# Patient Record
Sex: Male | Born: 1960 | Race: White | Hispanic: No | Marital: Single | Smoking: Never smoker
Health system: Southern US, Community
[De-identification: ages and names within clinical notes are randomized; demographics above are authoritative.]

## PROBLEM LIST (undated history)

## (undated) DIAGNOSIS — I10 Essential (primary) hypertension: Secondary | ICD-10-CM

## (undated) DIAGNOSIS — E119 Type 2 diabetes mellitus without complications: Secondary | ICD-10-CM

## (undated) HISTORY — PX: GANGLION CYST EXCISION: SHX1691

## (undated) HISTORY — PX: KNEE ARTHROSCOPY W/ ACL RECONSTRUCTION: SHX1858

---

## 2001-06-01 ENCOUNTER — Ambulatory Visit (HOSPITAL_BASED_OUTPATIENT_CLINIC_OR_DEPARTMENT_OTHER): Admission: RE | Admit: 2001-06-01 | Discharge: 2001-06-01 | Payer: Self-pay | Admitting: Family Medicine

## 2001-08-12 ENCOUNTER — Encounter: Payer: Self-pay | Admitting: Critical Care Medicine

## 2001-08-12 ENCOUNTER — Ambulatory Visit (HOSPITAL_BASED_OUTPATIENT_CLINIC_OR_DEPARTMENT_OTHER): Admission: RE | Admit: 2001-08-12 | Discharge: 2001-08-12 | Payer: Self-pay | Admitting: Critical Care Medicine

## 2005-03-19 ENCOUNTER — Ambulatory Visit: Payer: Self-pay | Admitting: Critical Care Medicine

## 2007-01-28 ENCOUNTER — Ambulatory Visit: Payer: Self-pay | Admitting: Critical Care Medicine

## 2008-11-29 ENCOUNTER — Telehealth (INDEPENDENT_AMBULATORY_CARE_PROVIDER_SITE_OTHER): Payer: Self-pay | Admitting: *Deleted

## 2008-11-30 ENCOUNTER — Encounter: Payer: Self-pay | Admitting: Critical Care Medicine

## 2008-11-30 DIAGNOSIS — G4733 Obstructive sleep apnea (adult) (pediatric): Secondary | ICD-10-CM | POA: Insufficient documentation

## 2009-01-02 ENCOUNTER — Ambulatory Visit: Payer: Self-pay | Admitting: Critical Care Medicine

## 2009-01-02 DIAGNOSIS — G4730 Sleep apnea, unspecified: Secondary | ICD-10-CM | POA: Insufficient documentation

## 2009-01-02 DIAGNOSIS — I10 Essential (primary) hypertension: Secondary | ICD-10-CM | POA: Insufficient documentation

## 2009-04-03 ENCOUNTER — Telehealth (INDEPENDENT_AMBULATORY_CARE_PROVIDER_SITE_OTHER): Payer: Self-pay | Admitting: *Deleted

## 2010-10-20 ENCOUNTER — Ambulatory Visit: Payer: Self-pay | Admitting: Critical Care Medicine

## 2010-11-28 ENCOUNTER — Encounter: Admission: RE | Admit: 2010-11-28 | Discharge: 2010-11-28 | Payer: Self-pay | Admitting: Family Medicine

## 2011-01-27 NOTE — Assessment & Plan Note (Signed)
Summary: Pulmonary OV   CC:  Follow up.  Last seen 12/2008.  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  No complaints.  .  History of Present Illness: [name] is a 50 year old white male, history of obstructive sleep apnea, maintains C-PAP at 8 cm water pressure via mask.    January 02, 2009 12:08 PM Last OV 2008.  See sleep form:   October 20, 2010 3:32 PM No new issues. ON cap 8cm h20 and doing well.     History of Present Illness: No real drowsiness   What time do you typically go to bed?(between what hours): 10-11pm  How long does it take you to fall asleep? Falls asleep quickly  How many times during the night do you wake up? Does not awaken at night  What time do you get out of bed to start your day? 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 (in pounds): Weight is up a few #     Current Medications (verified): 1)  Azor 10-40 Mg Tabs (Amlodipine-Olmesartan) .... Once Daily 2)  Zyrtec Allergy 10 Mg Tabs (Cetirizine Hcl) .... As Needed  Allergies (verified): No Known Drug Allergies  Past History:  Past medical, surgical, family and social histories (including risk factors) reviewed, and no changes noted (except as noted below).  Past Medical History: Reviewed history from 01/02/2009 and no changes required. Hypertension Sleep Apnea  Past Surgical History: Reviewed history from 01/02/2009 and no changes required. no new issues  Family History: Reviewed history and no changes required.  Social History: Reviewed history and no changes required. Never smoked  Vital Signs:  Patient profile:   50 year old male Height:      68 inches Weight:      239 pounds BMI:     36.47 O2 Sat:      97 % on Room air Temp:     98.6 degrees F oral Pulse rate:   92 / minute BP sitting:   130 / 78   (left arm) Cuff size:   large  Vitals Entered By: Gweneth Dimitri RN (October 20, 2010 3:16 PM)  O2 Flow:  Room air CC: Follow up.  Last seen 12/2008.  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  No complaints.   Comments Medications reviewed with patient Daytime contact number verified with patient. Gweneth Dimitri RN  October 20, 2010 3:17 PM    Physical Exam  Additional Exam:  Gen: Pleasant, well-nourished, in no distress,  normal affect ENT: No lesions,  mouth clear,  oropharynx clear, no postnasal drip Neck: No JVD, no TMG, no carotid bruits Lungs: No use of accessory muscles, no dullness to percussion, clear without rales or rhonchi Cardiovascular: RRR, heart sounds normal, no murmur or gallops, no peripheral edema Abdomen: soft and NT, no HSM,  BS normal Musculoskeletal: No deformities, no cyanosis or clubbing Neuro: alert, non focal Skin: Warm, no lesions or rashes    Impression & Recommendations:  Problem # 1:  SLEEP APNEA (ICD-780.57) Assessment Unchanged stable sleep apnea plan cont cpap as is pt is compliant with cpap Orders: Est. Patient Level III (16109)  Complete Medication List: 1)  Azor 10-40 Mg Tabs (Amlodipine-olmesartan) .... Once daily 2)  Zyrtec Allergy 10 Mg Tabs (Cetirizine hcl) .Marland KitchenMarland KitchenMarland Kitchen  As needed  Sleep Study  Procedure date:  10/15/2010  Findings:      Final CPAP pressure: 8 good compliance    Patient Instructions: 1)  No change in medications 2)  Stay on Cpap 8cmh20 as before 3)  Return in   12       months

## 2011-05-15 NOTE — Assessment & Plan Note (Signed)
[facility]                             PULMONARY OFFICE NOTE   SEANN, GENTHER                      MRN:          [id number]  DATE:01/28/2007                            DOB:          1961/04/16    Mr. Mccannon is a 50 year old white male, history of obstructive sleep  apnea, maintains C-PAP at 8 cm water pressure via mask.  He is doing  well, without complaints.  Weight maintains at 234, temperature 98,  blood pressure 146/84, pulse 82, saturation 94% on room air.  Chest  showed to be clear, without evidence of wheeze or rhonchi.  Cardiac exam  showed a regular rate and rhythm at S3, normal S1, S2.  Abdomen was protuberant.  Extremities showed no edema or clubbing.  Skin  was clear.  Neurologic exam was intact.  HEENT exam showed no jugular  venous distention, lymphadenopathy. Oropharynx clear.  Neck supple.   IMPRESSION:  That of stable obstructive sleep apnea.   PLAN:  For the patient to main C-PAP at 8 cm water pressure and we will  see the patient back in return followup in 24 months.     Charlcie Cradle Delford Field, MD, [facility]  Electronically Signed    PEW/MedQ  DD: 01/28/2007  DT: 01/28/2007  Job #: [id number]   cc:   Caryn Bee L. Little, M.D.

## 2013-01-22 ENCOUNTER — Emergency Department (HOSPITAL_COMMUNITY)
Admission: EM | Admit: 2013-01-22 | Discharge: 2013-01-22 | Disposition: A | Discharge status: Home / Self Care | Payer: BC Managed Care – PPO | Attending: Emergency Medicine | Admitting: Emergency Medicine

## 2013-01-22 ENCOUNTER — Encounter (HOSPITAL_COMMUNITY): Payer: Self-pay | Admitting: Emergency Medicine

## 2013-01-22 DIAGNOSIS — Y929 Unspecified place or not applicable: Secondary | ICD-10-CM | POA: Insufficient documentation

## 2013-01-22 DIAGNOSIS — Y939 Activity, unspecified: Secondary | ICD-10-CM | POA: Insufficient documentation

## 2013-01-22 DIAGNOSIS — X58XXXA Exposure to other specified factors, initial encounter: Secondary | ICD-10-CM | POA: Insufficient documentation

## 2013-01-22 DIAGNOSIS — T50901A Poisoning by unspecified drugs, medicaments and biological substances, accidental (unintentional), initial encounter: Secondary | ICD-10-CM | POA: Insufficient documentation

## 2013-01-22 DIAGNOSIS — T7840XA Allergy, unspecified, initial encounter: Secondary | ICD-10-CM

## 2013-01-22 HISTORY — DX: Type 2 diabetes mellitus without complications: E11.9

## 2013-01-22 HISTORY — DX: Essential (primary) hypertension: I10

## 2013-01-22 LAB — CBC WITH DIFFERENTIAL/PLATELET
Hemoglobin: 16.7 g/dL (ref 13.0–17.0)
Lymphocytes Relative: 5 % — ABNORMAL LOW (ref 12–46)
Lymphs Abs: 0.7 10*3/uL (ref 0.7–4.0)
Monocytes Relative: 2 % — ABNORMAL LOW (ref 3–12)
Neutro Abs: 11.5 10*3/uL — ABNORMAL HIGH (ref 1.7–7.7)
Neutrophils Relative %: 92 % — ABNORMAL HIGH (ref 43–77)
RBC: 5.37 MIL/uL (ref 4.22–5.81)

## 2013-01-22 LAB — COMPREHENSIVE METABOLIC PANEL
Albumin: 4 g/dL (ref 3.5–5.2)
Alkaline Phosphatase: 92 U/L (ref 39–117)
BUN: 14 mg/dL (ref 6–23)
CO2: 22 mEq/L (ref 19–32)
Chloride: 93 mEq/L — ABNORMAL LOW (ref 96–112)
GFR calc Af Amer: >90 mL/min (ref >90)
Glucose, Bld: 144 mg/dL — ABNORMAL HIGH (ref 70–99)
Potassium: 3 mEq/L — ABNORMAL LOW (ref 3.5–5.1)
Total Bilirubin: 0.9 mg/dL (ref 0.3–1.2)

## 2013-01-22 MED ORDER — METHYLPREDNISOLONE SODIUM SUCC 125 MG IJ SOLR
INTRAMUSCULAR | Status: AC
  Administered 2013-01-22: 125 mg via INTRAVENOUS
  Filled 2013-01-22: qty 2

## 2013-01-22 MED ORDER — DIPHENHYDRAMINE HCL 50 MG/ML IJ SOLN
INTRAMUSCULAR | Status: AC
  Administered 2013-01-22: 25 mg via INTRAVENOUS
  Filled 2013-01-22: qty 1

## 2013-01-22 MED ORDER — FAMOTIDINE IN NACL 20-0.9 MG/50ML-% IV SOLN
20.0000 mg | Freq: Once | INTRAVENOUS | Status: AC
  Administered 2013-01-22: 20 mg via INTRAVENOUS

## 2013-01-22 MED ORDER — FAMOTIDINE IN NACL 20-0.9 MG/50ML-% IV SOLN
INTRAVENOUS | Status: AC
  Administered 2013-01-22: 20 mg via INTRAVENOUS
  Filled 2013-01-22: qty 50

## 2013-01-22 MED ORDER — SODIUM CHLORIDE 0.9 % IV BOLUS (SEPSIS)
1000.0000 mL | Freq: Once | INTRAVENOUS | Status: AC
  Administered 2013-01-22: 1000 mL via INTRAVENOUS

## 2013-01-22 MED ORDER — POTASSIUM CHLORIDE CRYS ER 20 MEQ PO TBCR
40.0000 meq | EXTENDED_RELEASE_TABLET | Freq: Once | ORAL | Status: AC
  Administered 2013-01-22: 40 meq via ORAL
  Filled 2013-01-22: qty 2

## 2013-01-22 MED ORDER — DIPHENHYDRAMINE HCL 50 MG/ML IJ SOLN
25.0000 mg | Freq: Once | INTRAMUSCULAR | Status: AC
  Administered 2013-01-22: 25 mg via INTRAVENOUS

## 2013-01-22 MED ORDER — METHYLPREDNISOLONE SODIUM SUCC 125 MG IJ SOLR
125.0000 mg | Freq: Once | INTRAMUSCULAR | Status: AC
  Administered 2013-01-22: 125 mg via INTRAVENOUS

## 2013-01-22 NOTE — ED Provider Notes (Signed)
History     CSN: [id number]  Arrival date & time 01/22/13  1617   First MD Initiated Contact with Patient 01/22/13 1619      No chief complaint on file.    HPI  The patient presents with worsening tongue swelling, hives, generalized discomfort. This episode began 3 days ago, initially with lip swelling.  He initially spoke with his physician, began taking Benadryl daily.  Over the interval days he developed diffuse urticarial lesions, generalized discomfort, and over the past half day has developed glossal edema, difficulty swallowing.  His diffuse tingling has become more severe, and his urticarial lesions have increased, become diffuse.  There is no fever, no chest pain, no dyspnea, no abdominal pain, no nausea, no vomiting. Earlier today he went to an urgent care Center, received prednisone.  He states that even since that encounter, his symptoms have been progressive.  No past medical history on file.  No past surgical history on file.  No family history on file.  History  Substance Use Topics  . Smoking status: Not on file  . Smokeless tobacco: Not on file  . Alcohol Use: Not on file      Review of Systems  Constitutional:       Per HPI, otherwise negative  HENT:       Per HPI, otherwise negative  Eyes: Negative.   Respiratory:       Per HPI, otherwise negative  Cardiovascular:       Per HPI, otherwise negative  Gastrointestinal: Negative for vomiting.  Genitourinary: Negative.   Musculoskeletal:       Per HPI, otherwise negative  Skin: Negative.   Neurological: Negative for syncope.    Allergies  Review of patient's allergies indicates not on file.  Home Medications  No current outpatient prescriptions on file.  BP 147/104  Pulse 98  Temp 98.5 F (36.9 C) (Oral)  Resp 26  SpO2 98%  Physical Exam  Nursing note and vitals reviewed. Constitutional: He is oriented to person, place, and time. He appears well-developed. No distress.  HENT:  Head:  Normocephalic and atraumatic. No trismus in the jaw.  Mouth/Throat: Uvula is midline and mucous membranes are normal. No uvula swelling. No oropharyngeal exudate, posterior oropharyngeal edema or posterior oropharyngeal erythema.    Eyes: Conjunctivae normal and EOM are normal.  Neck: No tracheal deviation present.  Cardiovascular: Normal rate and regular rhythm.   Pulmonary/Chest: Effort normal. No stridor. No respiratory distress.  Abdominal: He exhibits no distension.  Musculoskeletal: He exhibits no edema.  Neurological: He is alert and oriented to person, place, and time.  Skin: Skin is warm and dry.       Diffuse urticarial lesions throughout the habitus, stopping in the mid thigh.  No oropharyngeal lesion  Psychiatric: He has a normal mood and affect.    ED Course  Procedures (including critical care time)   Labs Reviewed  CBC WITH DIFFERENTIAL  COMPREHENSIVE METABOLIC PANEL   No results found.   No diagnosis found.  O2- 99%ra, normal  Immediately after arrival, with worsening Sx the patient received additional meds, IVF.  5:34 PM Patient substantially better.  Hives receeding.  Tongue edema improving.  7:07 PM Patient continued to improve, hives are entirely gone.  Tongue swelling resolved.  The patient does have mild edema about the right forearm and hand.  No pain, no pain with passive motion.  No erythema.  The patient was receiving IV fluids prior to the onset of the swelling,  but no other notable medication.  MDM  This patient presents with allergic reaction.  On exam the patient has glossal swelling, diffuse urticarial lesions, tachypnea, but is not hypoxic.  The patient presents initially with IV fluids, steroids, antihistamines.  After.  Of observation following initial interventions, the patient is appropriate for discharge with close outpatient followup.  He was discharged in stable condition.  Gerhard Munch, MD 01/22/13 Izell Coburn

## 2013-01-22 NOTE — ED Notes (Signed)
Pt c/o R hand swelling that started since arrival. Pt hand elevated. Dr. Jeraldine Loots notified

## 2013-01-22 NOTE — ED Notes (Signed)
Pt from home reports allergic reaction x2 days. Pt has been seen by allergist with no source of reaction found. Pt c/o difficulty swallowing and worsening rash. Pt in NAD and A&Ox4.

## 2013-01-24 ENCOUNTER — Emergency Department (HOSPITAL_COMMUNITY)
Admission: EM | Admit: 2013-01-24 | Discharge: 2013-01-24 | Disposition: A | Discharge status: Home / Self Care | Payer: BC Managed Care – PPO | Attending: Emergency Medicine | Admitting: Emergency Medicine

## 2013-01-24 ENCOUNTER — Encounter (HOSPITAL_COMMUNITY): Payer: Self-pay | Admitting: Emergency Medicine

## 2013-01-24 DIAGNOSIS — R1310 Dysphagia, unspecified: Secondary | ICD-10-CM | POA: Insufficient documentation

## 2013-01-24 DIAGNOSIS — Z7982 Long term (current) use of aspirin: Secondary | ICD-10-CM | POA: Insufficient documentation

## 2013-01-24 DIAGNOSIS — I10 Essential (primary) hypertension: Secondary | ICD-10-CM | POA: Insufficient documentation

## 2013-01-24 DIAGNOSIS — E119 Type 2 diabetes mellitus without complications: Secondary | ICD-10-CM | POA: Insufficient documentation

## 2013-01-24 DIAGNOSIS — Z79899 Other long term (current) drug therapy: Secondary | ICD-10-CM | POA: Insufficient documentation

## 2013-01-24 DIAGNOSIS — T7840XA Allergy, unspecified, initial encounter: Secondary | ICD-10-CM

## 2013-01-24 DIAGNOSIS — R21 Rash and other nonspecific skin eruption: Secondary | ICD-10-CM | POA: Insufficient documentation

## 2013-01-24 DIAGNOSIS — IMO0002 Reserved for concepts with insufficient information to code with codable children: Secondary | ICD-10-CM | POA: Insufficient documentation

## 2013-01-24 MED ORDER — EPINEPHRINE 0.3 MG/0.3ML IJ DEVI: 0.3000 mg | Freq: Once | INTRAMUSCULAR | Status: AC

## 2013-01-24 MED ORDER — DIPHENHYDRAMINE HCL 25 MG PO CAPS
50.0000 mg | ORAL_CAPSULE | Freq: Once | ORAL | Status: AC
  Administered 2013-01-24: 50 mg via ORAL
  Filled 2013-01-24: qty 2

## 2013-01-24 NOTE — ED Notes (Signed)
Pt moved to Room 7 to be monitored.  Pt in NAD, rash still present on trunk and upper arms but pt states it does not itch and has not gotten worse since arrival.  Pt denies SOB, facial or oral swelling, or any additional c/o.  Report given to [place].

## 2013-01-24 NOTE — ED Notes (Addendum)
Per EMS, pt has been seeing doctor for allergic reactions to unknown allergen.  Today, pt developed hives, lip swelling, self-administered Epi Pen and took PO Benadryl.  Pt called PCP and was advised to come to ED to be monitored because they could not see him until 2.  Pt states symptoms have resolved at this time.

## 2013-01-24 NOTE — ED Provider Notes (Signed)
Medical screening examination/treatment/procedure(s) were performed by non-physician practitioner and as supervising physician I was immediately available for consultation/collaboration.  Tobin Chad, MD 01/24/13 1357

## 2013-01-24 NOTE — ED Notes (Signed)
MD at bedside. 

## 2013-01-24 NOTE — ED Provider Notes (Signed)
History     CSN: [id number]  Arrival date & time 01/24/13  1023   First MD Initiated Contact with Patient 01/24/13 1032      Chief Complaint  Patient presents with  . Allergic Reaction    (Consider location/radiation/quality/duration/timing/severity/associated sxs/prior treatment) Patient is a 52 y.o. male presenting with allergic reaction. The history is provided by the patient.  Allergic Reaction The primary symptoms are  rash. The primary symptoms do not include shortness of breath, abdominal pain or vomiting. Primary symptoms comment: Difficulty swallowing. The current episode started 1 to 2 hours ago. The problem has been gradually improving. This is a recurrent problem.  Associated symptoms comments: He has been having recurrent allergic type reactions for the past 2 months, gradually becoming more frequent. The reaction usually consists of lip or facial swelling and generalized hives. 2 days ago the reactions also involved difficulty swallowing prompting ED visit. He was discharged home on steroids and EpiPen. Today, he had another reaction causing dysphagia and he used the Epi Pen with relief of symptoms. He is feeling better over time. He was instructed by his allergist to return to the ED.Marland Kitchen    Past Medical History  Diagnosis Date  . Hypertension   . Diabetes mellitus without complication     Past Surgical History  Procedure Date  . Knee arthroscopy w/ acl reconstruction bilateral  . Ganglion cyst excision L wrist    History reviewed. No pertinent family history.  History  Substance Use Topics  . Smoking status: Never Smoker   . Smokeless tobacco: Current User    Types: Chew  . Alcohol Use: 16.8 oz/week    28 Glasses of wine per week      Review of Systems  Constitutional: Negative for fever and chills.  HENT: Positive for trouble swallowing. Negative for sore throat and facial swelling.   Respiratory: Negative.  Negative for shortness of breath.     Cardiovascular: Negative.   Gastrointestinal: Negative.  Negative for vomiting and abdominal pain.  Musculoskeletal: Negative.  Negative for myalgias.  Skin: Positive for rash.  Neurological: Negative.   Hematological: Does not bruise/bleed easily.  Psychiatric/Behavioral: Negative for confusion.    Allergies  Review of patient's allergies indicates no known allergies.  Home Medications   Current Outpatient Rx  Name  Route  Sig  Dispense  Refill  . AMLODIPINE BESYLATE 10 MG PO TABS   Oral   Take 10 mg by mouth daily.         . ASPIRIN 81 MG PO TABS   Oral   Take 81 mg by mouth daily.         . ATORVASTATIN CALCIUM 10 MG PO TABS   Oral   Take 10 mg by mouth daily.         Marland Kitchen DIPHENHYDRAMINE HCL 25 MG PO CAPS   Oral   Take 50 mg by mouth every 6 (six) hours as needed. For allergies         . HYDROCHLOROTHIAZIDE 25 MG PO TABS   Oral   Take 25 mg by mouth daily.         Marland Kitchen PREDNISONE 20 MG PO TABS   Oral   Take 10-60 mg by mouth daily. Take 3 tablets for 2 days, 2 tablets for 2 days, 1 tablet for 2 days, 0.5 tablets for 2 days         . VISINE OP   Both Eyes   Place 1 drop into both eyes  daily as needed. For itching or dry eyes           BP 122/81  Pulse 92  Temp 98.4 F (36.9 C) (Oral)  Resp 18  SpO2 97%  Physical Exam  Constitutional: He is oriented to person, place, and time. He appears well-developed and well-nourished.  HENT:  Head: Normocephalic.  Mouth/Throat: Oropharynx is clear and moist.       No oral surface swelling.  Neck: Normal range of motion. Neck supple. No tracheal deviation present.  Cardiovascular: Normal rate and regular rhythm.   Pulmonary/Chest: Effort normal and breath sounds normal. No stridor. He has no wheezes. He has no rales.  Abdominal: Soft. Bowel sounds are normal. There is no tenderness. There is no rebound and no guarding.  Musculoskeletal: Normal range of motion.  Neurological: He is alert and oriented to  person, place, and time.  Skin: Skin is warm and dry. No rash noted.  Psychiatric: He has a normal mood and affect.    ED Course  Procedures (including critical care time)  Labs Reviewed - No data to display No results found.   No diagnosis found.  1. Allergic reaction to unknown allergen  MDM  Patient monitored for the past 3 hours after EpiPen injection. He continues to feel well and rash is resolving. No trouble swallowing or breathing. He has an appointment with his allergist at [facility] tomorrow at 1:30. The allergist is aware that patient has been here and events of today.        Arnoldo Hooker, PA-C 01/24/13 1224

## 2015-01-14 ENCOUNTER — Other Ambulatory Visit: Payer: Self-pay | Admitting: Family Medicine

## 2015-01-14 DIAGNOSIS — R748 Abnormal levels of other serum enzymes: Secondary | ICD-10-CM

## 2015-01-25 ENCOUNTER — Ambulatory Visit
Admission: RE | Admit: 2015-01-25 | Discharge: 2015-01-25 | Disposition: A | Discharge status: Home / Self Care | Payer: BLUE CROSS/BLUE SHIELD | Source: Ambulatory Visit | Attending: Family Medicine | Admitting: Family Medicine

## 2015-01-25 DIAGNOSIS — R748 Abnormal levels of other serum enzymes: Secondary | ICD-10-CM

## 2017-10-14 ENCOUNTER — Other Ambulatory Visit: Payer: Self-pay | Admitting: Family Medicine

## 2017-10-14 DIAGNOSIS — M25521 Pain in right elbow: Secondary | ICD-10-CM

## 2017-10-14 DIAGNOSIS — R9389 Abnormal findings on diagnostic imaging of other specified body structures: Secondary | ICD-10-CM

## 2017-11-08 ENCOUNTER — Ambulatory Visit
Admission: RE | Admit: 2017-11-08 | Discharge: 2017-11-08 | Disposition: A | Discharge status: Home / Self Care | Payer: BLUE CROSS/BLUE SHIELD | Source: Ambulatory Visit | Attending: Family Medicine | Admitting: Family Medicine

## 2017-11-08 DIAGNOSIS — M25521 Pain in right elbow: Secondary | ICD-10-CM

## 2017-11-08 DIAGNOSIS — R9389 Abnormal findings on diagnostic imaging of other specified body structures: Secondary | ICD-10-CM

## 2017-11-08 MED ORDER — GADOBENATE DIMEGLUMINE 529 MG/ML IV SOLN
18.0000 mL | Freq: Once | INTRAVENOUS | Status: AC | PRN
  Administered 2017-11-08: 18 mL via INTRAVENOUS

## 2019-12-04 ENCOUNTER — Other Ambulatory Visit: Payer: Self-pay | Admitting: Family Medicine

## 2019-12-04 DIAGNOSIS — K760 Fatty (change of) liver, not elsewhere classified: Secondary | ICD-10-CM

## 2019-12-13 ENCOUNTER — Ambulatory Visit
Admission: RE | Admit: 2019-12-13 | Discharge: 2019-12-13 | Disposition: A | Discharge status: Home / Self Care | Payer: BLUE CROSS/BLUE SHIELD | Source: Ambulatory Visit | Attending: Family Medicine | Admitting: Family Medicine

## 2019-12-13 DIAGNOSIS — K760 Fatty (change of) liver, not elsewhere classified: Secondary | ICD-10-CM

## 2020-02-24 ENCOUNTER — Ambulatory Visit: Payer: BC Managed Care – PPO | Attending: Internal Medicine

## 2020-02-24 DIAGNOSIS — Z23 Encounter for immunization: Secondary | ICD-10-CM

## 2020-02-24 NOTE — Progress Notes (Signed)
   Covid-19 Vaccination Clinic  Name:  Devon French    MRN: [id number] DOB: 1961-01-13  02/24/2020  [name] was observed post Covid-19 immunization for 15 minutes without incidence. He was provided with Vaccine Information Sheet and instruction to access the V-Safe system.   [name] was instructed to call 911 with any severe reactions post vaccine: Marland Kitchen Difficulty breathing  . Swelling of your face and throat  . A fast heartbeat  . A bad rash all over your body  . Dizziness and weakness    Immunizations Administered    Name Date Dose VIS Date Route   Pfizer COVID-19 Vaccine 02/24/2020 12:49 PM 0.3 mL 12/08/2019 Intramuscular   Manufacturer: [employer], [employer]   Lot: [id number]   NDC: [address]-2

## 2020-03-16 ENCOUNTER — Ambulatory Visit: Payer: BC Managed Care – PPO | Attending: Internal Medicine

## 2020-03-16 DIAGNOSIS — Z23 Encounter for immunization: Secondary | ICD-10-CM

## 2020-03-16 NOTE — Progress Notes (Signed)
   Covid-19 Vaccination Clinic  Name:  Devon French    MRN: [id number] DOB: April 12, 1961  03/16/2020  Devon French was observed post Covid-19 immunization for 30 minutes based on pre-vaccination screening without incident. He was provided with Vaccine Information Sheet and instruction to access the V-Safe system.   Devon French was instructed to call 911 with any severe reactions post vaccine: Marland Kitchen Difficulty breathing  . Swelling of face and throat  . A fast heartbeat  . A bad rash all over body  . Dizziness and weakness   Immunizations Administered    Name Date Dose VIS Date Route   Pfizer COVID-19 Vaccine 03/16/2020  9:54 AM 0.3 mL 12/08/2019 Intramuscular   Manufacturer: [employer], [employer]   Lot: [id number]   NDC: [address]-7

## 2020-03-26 ENCOUNTER — Ambulatory Visit: Payer: Self-pay

## 2020-08-16 ENCOUNTER — Encounter: Payer: Self-pay | Admitting: Cardiology

## 2020-08-16 ENCOUNTER — Other Ambulatory Visit: Payer: Self-pay

## 2020-08-16 ENCOUNTER — Ambulatory Visit: Payer: BC Managed Care – PPO | Admitting: Cardiology

## 2020-08-16 VITALS — BP 120/76 | HR 84 | Ht 68.0 in | Wt 187.2 lb

## 2020-08-16 DIAGNOSIS — I10 Essential (primary) hypertension: Secondary | ICD-10-CM

## 2020-08-16 DIAGNOSIS — E785 Hyperlipidemia, unspecified: Secondary | ICD-10-CM

## 2020-08-16 DIAGNOSIS — R011 Cardiac murmur, unspecified: Secondary | ICD-10-CM | POA: Diagnosis not present

## 2020-08-16 NOTE — Progress Notes (Signed)
Cardiology Office Note:    Date:  08/16/2020   ID:  Devon French, DOB 1961-06-22, MRN [id number]  PCP:  Devon Gosselin, MD  Cardiologist:  No primary care provider on file.  Electrophysiologist:  None   Referring MD: Devon Gosselin, MD   Chief Complaint  Patient presents with  . Heart Murmur    History of Present Illness:    Devon French is a 59 y.o. male with a hx of diabetes, OSA, hypertension, hyperlipidemia, who is referred by Dr. Clarene Duke for evaluation of heart murmur.  He reports that he walks every day for 20 minutes with his dogs and then walks 30 to 40 minutes on the treadmill every other day.  Denies any exertional chest pain or dyspnea.  Denies any lightheadedness, syncope, or palpitations.  Reports occasional lower extremity edema when he is on his feet all day.  States that he lost about 60 pounds about 5 years ago.  He has not been on CPAP since his weight loss.  No smoking history.  No history of heart disease in his immediate family.   Past Medical History:  Diagnosis Date  . Diabetes mellitus without complication (HCC)   . Hypertension     Past Surgical History:  Procedure Laterality Date  . GANGLION CYST EXCISION  L wrist  . KNEE ARTHROSCOPY W/ ACL RECONSTRUCTION  bilateral    Current Medications: Current Meds  Medication Sig  . amLODipine (NORVASC) 10 MG tablet Take 10 mg by mouth daily.  Marland Kitchen atorvastatin (LIPITOR) 10 MG tablet Take 10 mg by mouth daily.  Marland Kitchen EPINEPHrine (EPIPEN) 0.3 mg/0.3 mL DEVI Inject 0.3 mLs (0.3 mg total) into the muscle once.  . gabapentin (NEURONTIN) 300 MG capsule Take 300 mg by mouth 3 (three) times daily.  . hydrochlorothiazide (HYDRODIURIL) 25 MG tablet Take 25 mg by mouth daily.  Marland Kitchen JARDIANCE 10 MG TABS tablet Take 10 mg by mouth daily.  . metFORMIN (GLUCOPHAGE) 1000 MG tablet Take 1,000 mg by mouth 2 (two) times daily.     Allergies:   Patient has no known allergies.   Social History   Socioeconomic History  . Marital  status: Single    Spouse name: Not on file  . Number of children: Not on file  . Years of education: Not on file  . Highest education level: Not on file  Occupational History  . Not on file  Tobacco Use  . Smoking status: Never Smoker  . Smokeless tobacco: Current User    Types: Chew  Substance and Sexual Activity  . Alcohol use: Yes    Alcohol/week: 28.0 standard drinks    Types: 28 Glasses of wine per week  . Drug use: No  . Sexual activity: Not on file  Other Topics Concern  . Not on file  Social History Narrative  . Not on file   Social Determinants of Health   Financial Resource Strain:   . Difficulty of Paying Living Expenses: Not on file  Food Insecurity:   . Worried About Programme researcher, broadcasting/film/video in the Last Year: Not on file  . Ran Out of Food in the Last Year: Not on file  Transportation Needs:   . Lack of Transportation (Medical): Not on file  . Lack of Transportation (Non-Medical): Not on file  Physical Activity:   . Days of Exercise per Week: Not on file  . Minutes of Exercise per Session: Not on file  Stress:   . Feeling of Stress : Not on file  Social Connections:   . Frequency of Communication with Friends and Family: Not on file  . Frequency of Social Gatherings with Friends and Family: Not on file  . Attends Religious Services: Not on file  . Active Member of Clubs or Organizations: Not on file  . Attends Banker Meetings: Not on file  . Marital Status: Not on file     Family History: No history of heart disease in his immediate family  ROS:   Please see the history of present illness.     All other systems reviewed and are negative.  EKGs/Labs/Other Studies Reviewed:    The following studies were reviewed today:   EKG:  EKG is  ordered today.  The ekg ordered today demonstrates normal sinus rhythm, rate 84, nonspecific T wave flattening with T wave inversions in V5/6  Recent Labs: No results found for requested labs within last  8760 hours.  Recent Lipid Panel No results found for: CHOL, TRIG, HDL, CHOLHDL, VLDL, LDLCALC, LDLDIRECT  Physical Exam:    VS:  BP 120/76   Pulse 84   Ht 5\' 8"  (1.727 m)   Wt 187 lb 3.2 oz (84.9 kg)   SpO2 95%   BMI 28.46 kg/m     Wt Readings from Last 3 Encounters:  08/16/20 187 lb 3.2 oz (84.9 kg)     GEN: Well nourished, well developed in no acute distress HEENT: Normal NECK: No JVD; No carotid bruits LYMPHATICS: No lymphadenopathy CARDIAC: RRR, 2 out of 6 systolic murmur RESPIRATORY:  Clear to auscultation without rales, wheezing or rhonchi  ABDOMEN: Soft, non-tender, non-distended MUSCULOSKELETAL:  No edema; No deformity  SKIN: Warm and dry NEUROLOGIC:  Alert and oriented x 3 PSYCHIATRIC:  Normal affect   ASSESSMENT:    1. Heart murmur   2. Essential hypertension   3. Hyperlipidemia, unspecified hyperlipidemia type    PLAN:    Heart murmur: 2 out of 6 systolic murmur.  Will check echocardiogram  Hypertension: On amlodipine 10 mg daily and hydrochlorothiazide 25 mg daily.  Appears well controlled.    Type 2 diabetes: On Jardiance and Metformin.  A1c 6.9% on 08/01/2020  Hyperlipidemia: On atorvastatin 10 mg daily.  LDL 78 on 08/01/2020  OSA: Has been off CPAP since weight loss.  Lost 60 pounds.  RTC in 6 months   Medication Adjustments/Labs and Tests Ordered: Current medicines are reviewed at length with the patient today.  Concerns regarding medicines are outlined above.  Orders Placed This Encounter  Procedures  . EKG 12-Lead  . ECHOCARDIOGRAM COMPLETE   No orders of the defined types were placed in this encounter.   Patient Instructions  Medication Instructions:  Your physician recommends that you continue on your current medications as directed. Please refer to the Current Medication list given to you today.  Testing/Procedures: Your physician has requested that you have an echocardiogram. Echocardiography is a painless test that uses sound waves  to create images of your heart. It provides your doctor with information about the size and shape of your heart and how well your heart's chambers and valves are working. This procedure takes approximately one hour. There are no restrictions for this procedure.  This will be done at our [facility] location:  [facility] [address]  Follow-Up: At [facility], you and your health needs are our priority.  As part of our continuing mission to provide you with exceptional heart care, we have created designated Provider Care Teams.  These  Care Teams include your primary Cardiologist (physician) and Advanced Practice Providers (APPs -  Physician Assistants and Nurse Practitioners) who all work together to provide you with the care you need, when you need it.  We recommend signing up for the patient portal called "MyChart".  Sign up information is provided on this After Visit Summary.  MyChart is used to connect with patients for Virtual Visits (Telemedicine).  Patients are able to view lab/test results, encounter notes, upcoming appointments, etc.  Non-urgent messages can be sent to your provider as well.   To learn more about what you can do with MyChart, go to [email or web address].    Your next appointment:   6 month(s)  The format for your next appointment:   In Person  Provider:   Epifanio Lesches, MD       Signed, Little Ishikawa, MD  08/16/2020 11:06 AM    [facility]

## 2020-08-16 NOTE — Patient Instructions (Signed)
Medication Instructions:  Your physician recommends that you continue on your current medications as directed. Please refer to the Current Medication list given to you today.  Testing/Procedures: Your physician has requested that you have an echocardiogram. Echocardiography is a painless test that uses sound waves to create images of your heart. It provides your doctor with information about the size and shape of your heart and how well your heart's chambers and valves are working. This procedure takes approximately one hour. There are no restrictions for this procedure. This will be done at our [address] location:  [address]  Follow-Up: At [facility], you and your health needs are our priority.  As part of our continuing mission to provide you with exceptional heart care, we have created designated Provider Care Teams.  These Care Teams include your primary Cardiologist (physician) and Advanced Practice Providers (APPs -  Physician Assistants and Nurse Practitioners) who all work together to provide you with the care you need, when you need it.  We recommend signing up for the patient portal called "MyChart".  Sign up information is provided on this After Visit Summary.  MyChart is used to connect with patients for Virtual Visits (Telemedicine).  Patients are able to view lab/test results, encounter notes, upcoming appointments, etc.  Non-urgent messages can be sent to your provider as well.   To learn more about what you can do with MyChart, go to [email or web address].    Your next appointment:   6 month(s)  The format for your next appointment:   In Person  Provider:   Christopher Schumann, MD    

## 2020-09-03 ENCOUNTER — Ambulatory Visit (HOSPITAL_COMMUNITY): Payer: BC Managed Care – PPO | Attending: Cardiology

## 2020-09-03 ENCOUNTER — Other Ambulatory Visit: Payer: Self-pay

## 2020-09-03 DIAGNOSIS — R011 Cardiac murmur, unspecified: Secondary | ICD-10-CM

## 2020-09-03 LAB — ECHOCARDIOGRAM COMPLETE
Area-P 1/2: 2.55 cm2
S' Lateral: 2.5 cm

## 2020-10-02 ENCOUNTER — Encounter: Payer: Self-pay | Admitting: *Deleted

## 2021-02-09 NOTE — Progress Notes (Signed)
Cardiology Office Note:    Date:  02/10/2021   ID:  Devon French, DOB 1961/09/08, MRN [id number]  PCP:  Catha Gosselin, MD  Cardiologist:  No primary care provider on file.  Electrophysiologist:  None   Referring MD: Catha Gosselin, MD   Chief Complaint  Patient presents with  . Heart Murmur    History of Present Illness:    Devon French is a 60 y.o. male with a hx of diabetes, OSA, hypertension, hyperlipidemia, who presents for follow-up.  He was referred by Dr. Clarene Duke for evaluation of heart murmur, initially seen on 08/16/2020.  He reports that he walks every day for 20 minutes with his dogs and then walks 30 to 40 minutes on the treadmill every other day.  Denies any exertional chest pain or dyspnea.  Denies any lightheadedness, syncope, or palpitations.  Reports occasional lower extremity edema when he is on his feet all day.  States that he lost about 60 pounds about 5 years ago.  He has not been on CPAP since his weight loss.  No smoking history.  No history of heart disease in his immediate family.  Echocardiogram on 09/03/2020 showed hyperdynamic LV function, grade 1 diastolic dysfunction, normal RV function, no significant valvular disease.  Since last clinic visit, he reports that he has been doing well.  Denies any chest pain, dyspnea, lightheadedness, syncope, lower extremity edema.  Reports occasional palpitations after exerting himself, denies any at rest.  Reports that he walks 30 minutes on the treadmill 3 times per week.  No exertional symptoms.  He gained 10 lbs over the holidays.   Wt Readings from Last 3 Encounters:  02/10/21 197 lb 3.2 oz (89.4 kg)  08/16/20 187 lb 3.2 oz (84.9 kg)      Past Medical History:  Diagnosis Date  . Diabetes mellitus without complication (HCC)   . Hypertension     Past Surgical History:  Procedure Laterality Date  . GANGLION CYST EXCISION  L wrist  . KNEE ARTHROSCOPY W/ ACL RECONSTRUCTION  bilateral    Current  Medications: Current Meds  Medication Sig  . amLODipine (NORVASC) 10 MG tablet Take 10 mg by mouth daily.  Marland Kitchen atorvastatin (LIPITOR) 10 MG tablet Take 10 mg by mouth daily.  Marland Kitchen EPINEPHrine (EPIPEN) 0.3 mg/0.3 mL DEVI Inject 0.3 mLs (0.3 mg total) into the muscle once.  . gabapentin (NEURONTIN) 300 MG capsule Take 300 mg by mouth 3 (three) times daily.  . hydrochlorothiazide (HYDRODIURIL) 25 MG tablet Take 25 mg by mouth daily.  Marland Kitchen JARDIANCE 10 MG TABS tablet Take 10 mg by mouth daily.  . metFORMIN (GLUCOPHAGE) 1000 MG tablet Take 1,000 mg by mouth 2 (two) times daily.     Allergies:   Patient has no known allergies.   Social History   Socioeconomic History  . Marital status: Single    Spouse name: Not on file  . Number of children: Not on file  . Years of education: Not on file  . Highest education level: Not on file  Occupational History  . Not on file  Tobacco Use  . Smoking status: Never Smoker  . Smokeless tobacco: Current User    Types: Chew  Substance and Sexual Activity  . Alcohol use: Yes    Alcohol/week: 28.0 standard drinks    Types: 28 Glasses of wine per week  . Drug use: No  . Sexual activity: Not on file  Other Topics Concern  . Not on file  Social History Narrative  .  Not on file   Social Determinants of Health   Financial Resource Strain: Not on file  Food Insecurity: Not on file  Transportation Needs: Not on file  Physical Activity: Not on file  Stress: Not on file  Social Connections: Not on file     Family History: No history of heart disease in his immediate family  ROS:   Please see the history of present illness.     All other systems reviewed and are negative.  EKGs/Labs/Other Studies Reviewed:    The following studies were reviewed today:   EKG:  EKG is  ordered today.  The ekg ordered today demonstrates normal sinus rhythm, rate 83, nonspecific T wave flattening  Recent Labs: No results found for requested labs within last 8760  hours.  Recent Lipid Panel No results found for: CHOL, TRIG, HDL, CHOLHDL, VLDL, LDLCALC, LDLDIRECT  Physical Exam:    VS:  BP 120/68   Pulse 83   Ht 5\' 7"  (1.702 m)   Wt 197 lb 3.2 oz (89.4 kg)   SpO2 96%   BMI 30.89 kg/m     Wt Readings from Last 3 Encounters:  02/10/21 197 lb 3.2 oz (89.4 kg)  08/16/20 187 lb 3.2 oz (84.9 kg)     GEN: Well nourished, well developed in no acute distress HEENT: Normal NECK: No JVD; No carotid bruits CARDIAC: RRR, no murmur RESPIRATORY:  Clear to auscultation without rales, wheezing or rhonchi  ABDOMEN: Soft, non-tender, non-distended MUSCULOSKELETAL:  No edema; No deformity  SKIN: Warm and dry NEUROLOGIC:  Alert and oriented x 3 PSYCHIATRIC:  Normal affect   ASSESSMENT:    1. Heart murmur   2. Essential hypertension   3. Hyperlipidemia, unspecified hyperlipidemia type   4. OSA (obstructive sleep apnea)    PLAN:    Heart murmur: 2 out of 6 systolic murmur.  Echocardiogram on 09/03/2020 showed hyperdynamic LV function, grade 1 diastolic dysfunction, normal RV function, no significant valvular disease.  No further cardiac work-up recommended, suspect benign flow murmur  Hypertension: On amlodipine 10 mg daily and hydrochlorothiazide 25 mg daily.  Appears well controlled.    Type 2 diabetes: On Jardiance and Metformin.  A1c 6.9% on 08/01/2020  Hyperlipidemia: On atorvastatin 10 mg daily.  LDL 78 on 08/01/2020  OSA: Has been off CPAP since weight loss.  Lost 60 pounds.  RTC in 1 year   Medication Adjustments/Labs and Tests Ordered: Current medicines are reviewed at length with the patient today.  Concerns regarding medicines are outlined above.  Orders Placed This Encounter  Procedures  . EKG 12-Lead   No orders of the defined types were placed in this encounter.   Patient Instructions  Medication Instructions:  Your physician recommends that you continue on your current medications as directed. Please refer to the Current  Medication list given to you today.  *If you need a refill on your cardiac medications before your next appointment, please call your pharmacy*  Follow-Up: At [facility], you and your health needs are our priority.  As part of our continuing mission to provide you with exceptional heart care, we have created designated Provider Care Teams.  These Care Teams include your primary Cardiologist (physician) and Advanced Practice Providers (APPs -  Physician Assistants and Nurse Practitioners) who all work together to provide you with the care you need, when you need it.  We recommend signing up for the patient portal called "MyChart".  Sign up information is provided on this After Visit Summary.  MyChart is used to connect with patients for Virtual Visits (Telemedicine).  Patients are able to view lab/test results, encounter notes, upcoming appointments, etc.  Non-urgent messages can be sent to your provider as well.   To learn more about what you can do with MyChart, go to [email or web address].    Your next appointment:   12 month(s)  The format for your next appointment:   In Person  Provider:   Epifanio Lesches, MD      Signed, Little Ishikawa, MD  02/10/2021 2:13 PM    [facility]

## 2021-02-10 ENCOUNTER — Other Ambulatory Visit: Payer: Self-pay

## 2021-02-10 ENCOUNTER — Encounter: Payer: Self-pay | Admitting: Cardiology

## 2021-02-10 ENCOUNTER — Ambulatory Visit (INDEPENDENT_AMBULATORY_CARE_PROVIDER_SITE_OTHER): Payer: BC Managed Care – PPO | Admitting: Cardiology

## 2021-02-10 VITALS — BP 120/68 | HR 83 | Ht 67.0 in | Wt 197.2 lb

## 2021-02-10 DIAGNOSIS — E785 Hyperlipidemia, unspecified: Secondary | ICD-10-CM | POA: Diagnosis not present

## 2021-02-10 DIAGNOSIS — I10 Essential (primary) hypertension: Secondary | ICD-10-CM | POA: Diagnosis not present

## 2021-02-10 DIAGNOSIS — G4733 Obstructive sleep apnea (adult) (pediatric): Secondary | ICD-10-CM

## 2021-02-10 DIAGNOSIS — R011 Cardiac murmur, unspecified: Secondary | ICD-10-CM | POA: Diagnosis not present

## 2021-02-10 NOTE — Patient Instructions (Signed)

## 2021-06-21 IMAGING — US US ABDOMEN LIMITED
1 series · 14 of 25 positions shown · non-contrast
Comparison: January 25, 2015.

CLINICAL DATA: Fatty liver.

EXAM:
ULTRASOUND ABDOMEN LIMITED RIGHT UPPER QUADRANT

[Series 1: us abdomen limited · 0.25mm/px · 14 of 42 slices shown]
[im 1/42]
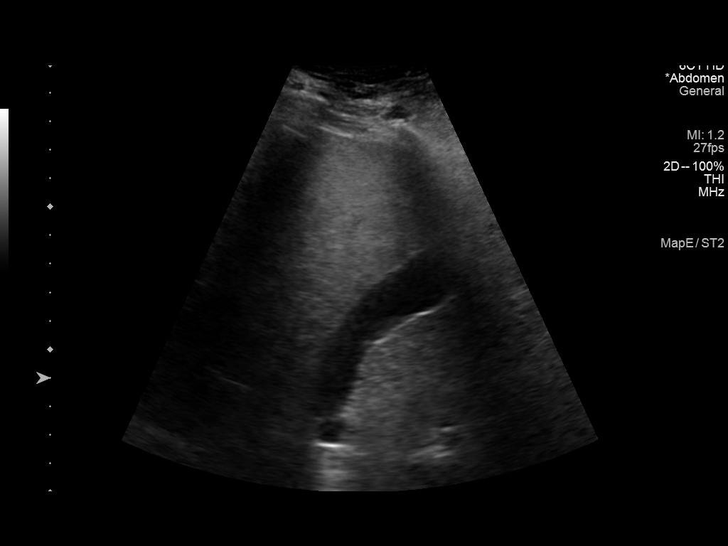
[im 4/42]
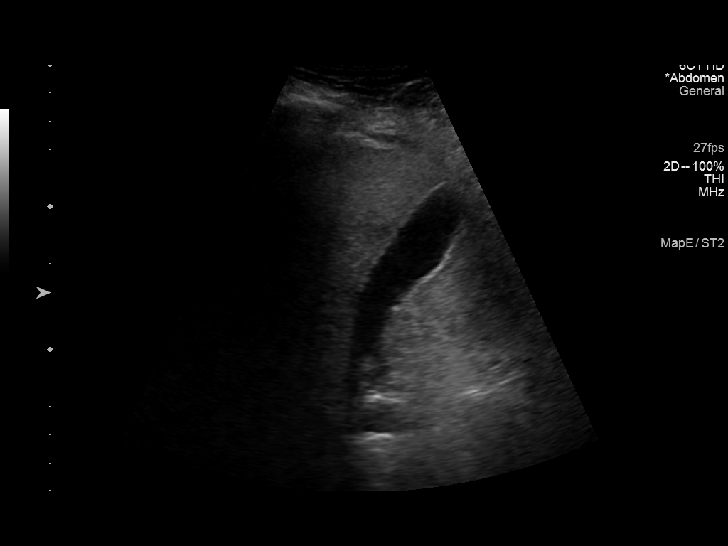
[im 7/42]
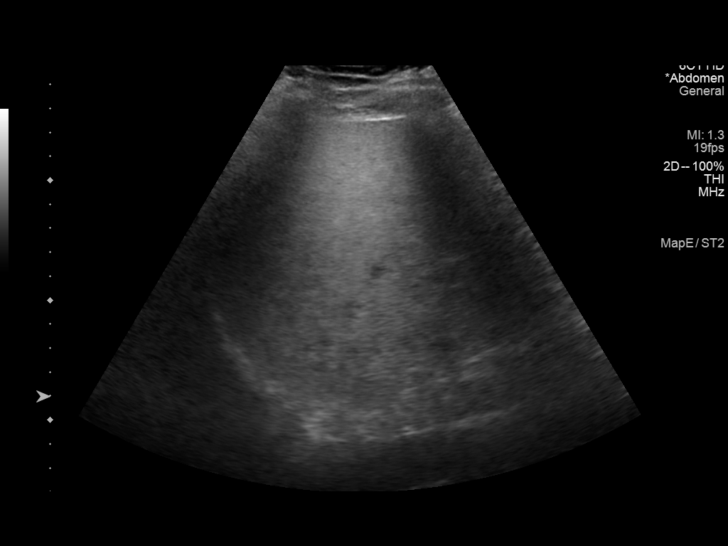
[im 11/42]
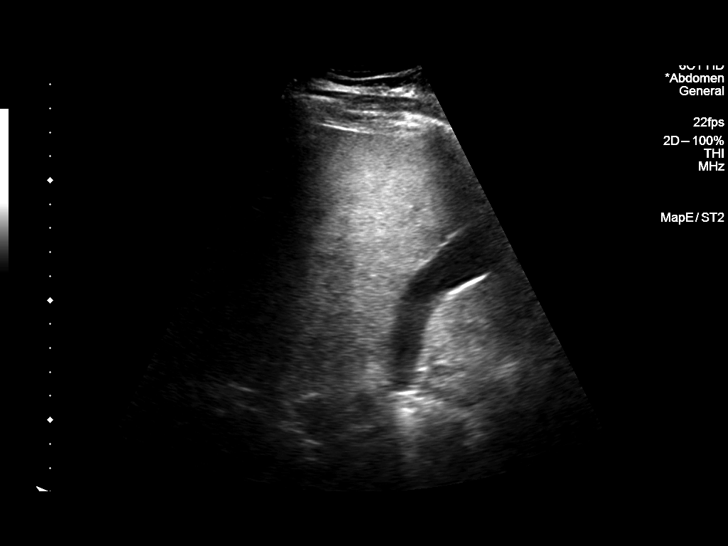
[im 14/42]
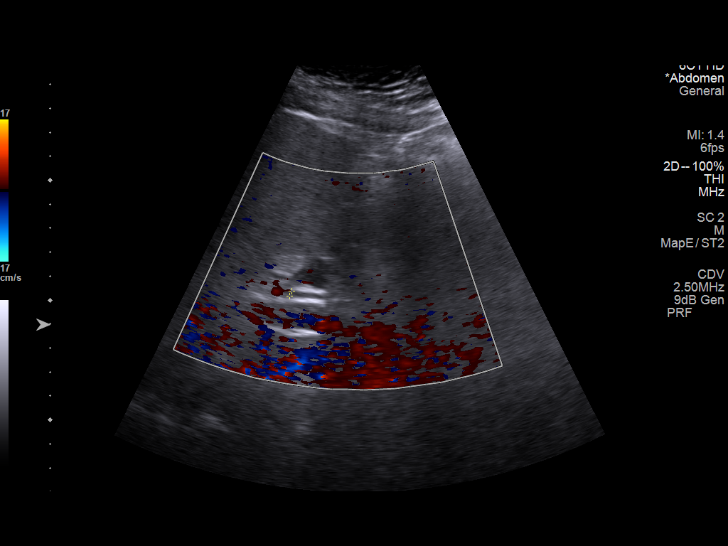
[im 16/42]
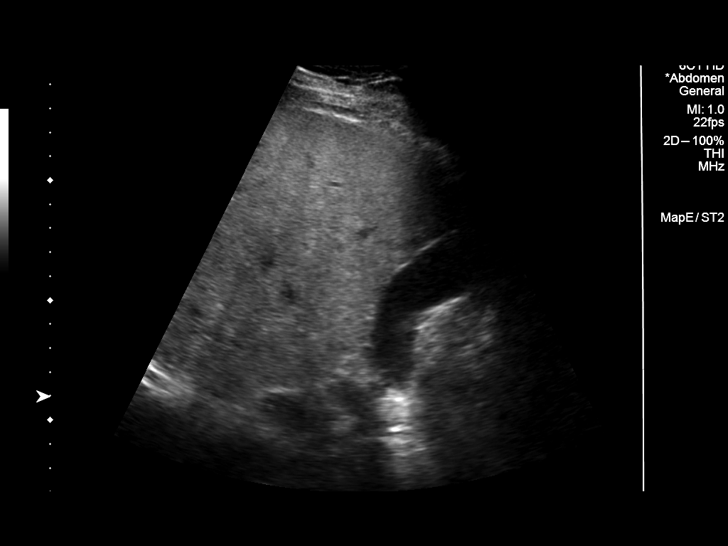
[im 19/42]
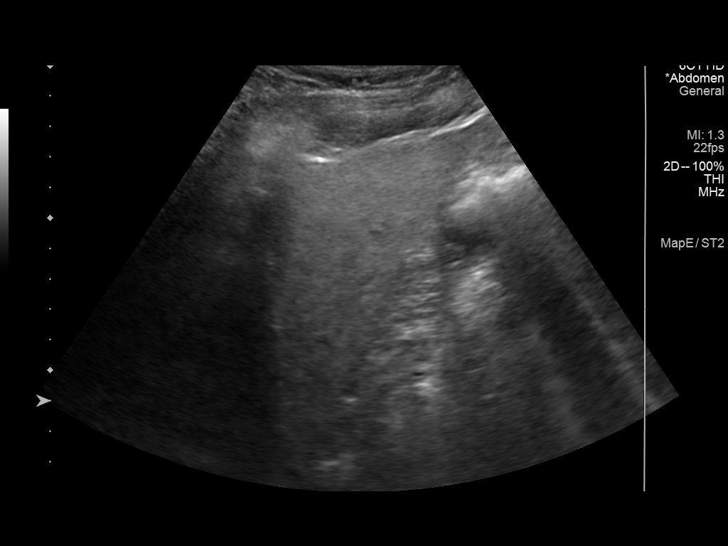
[im 23/42]
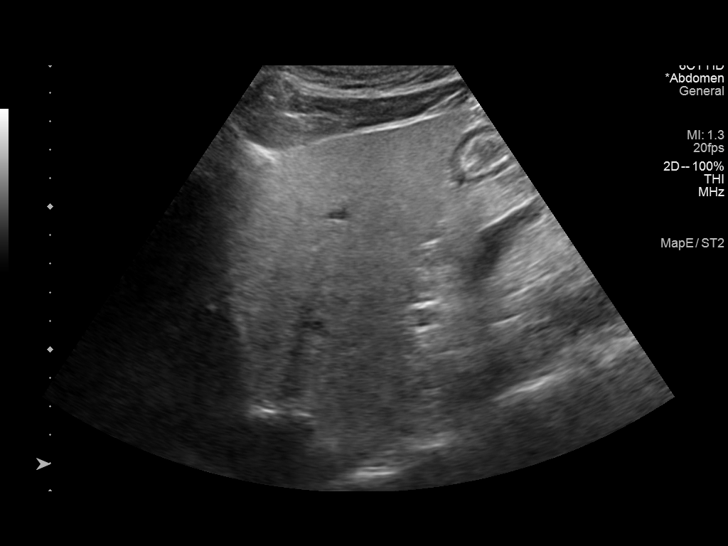
[im 26/42]
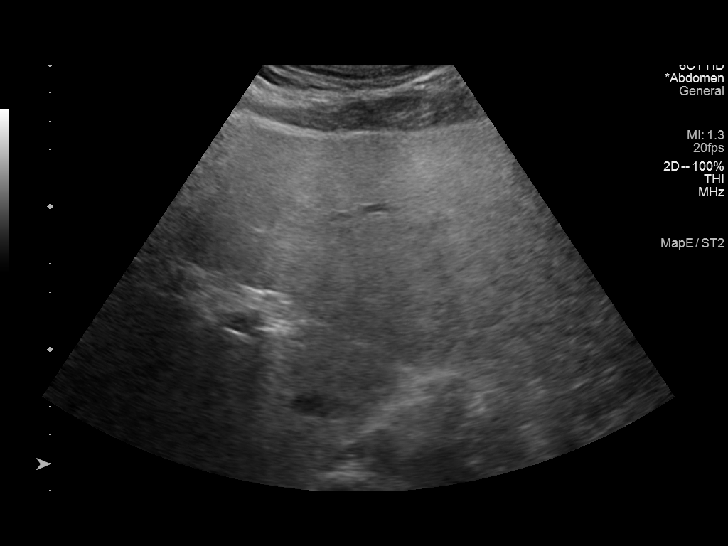
[im 28/42]
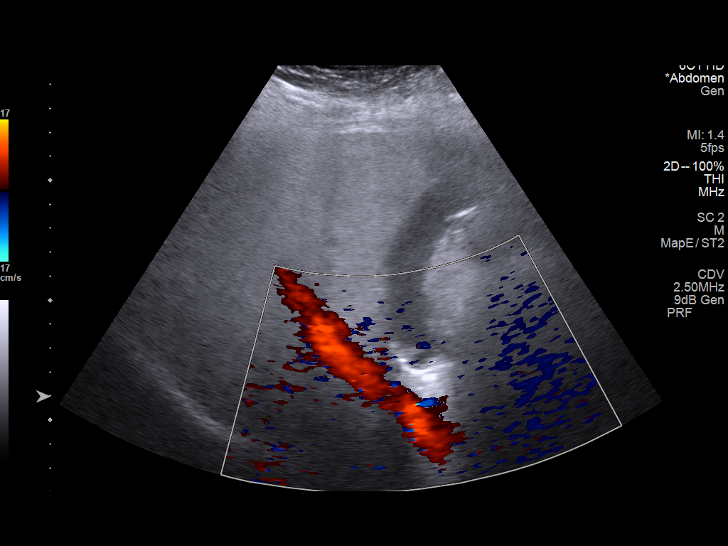
[im 31/42]
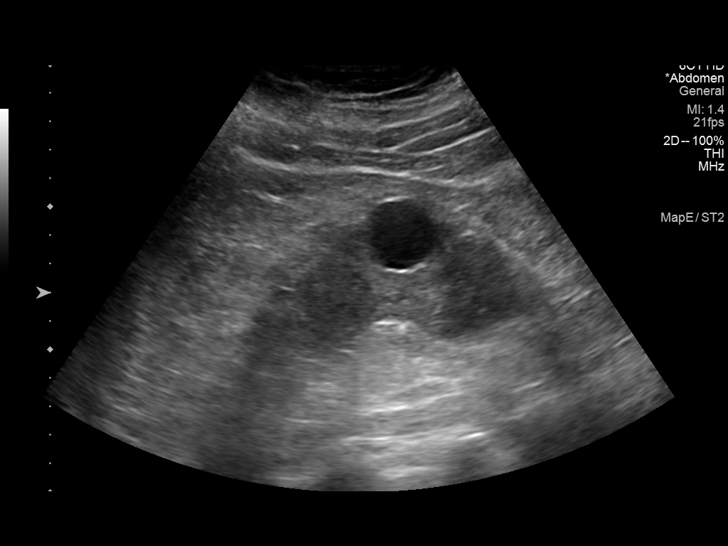
[im 35/42]
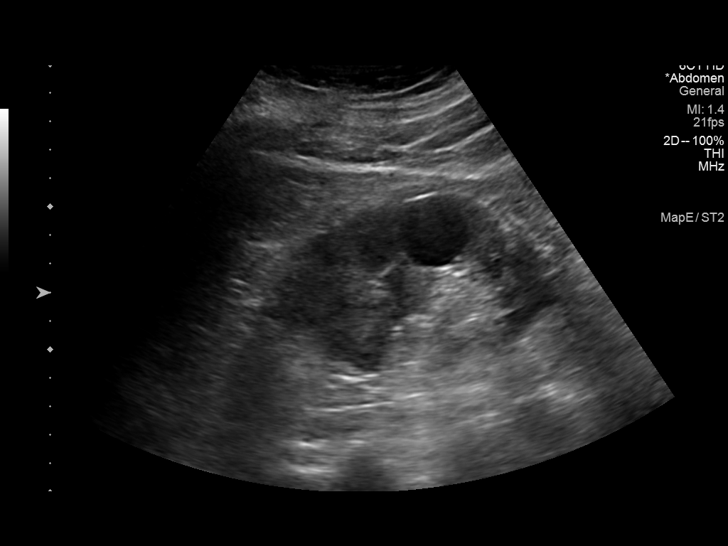
[im 38/42]
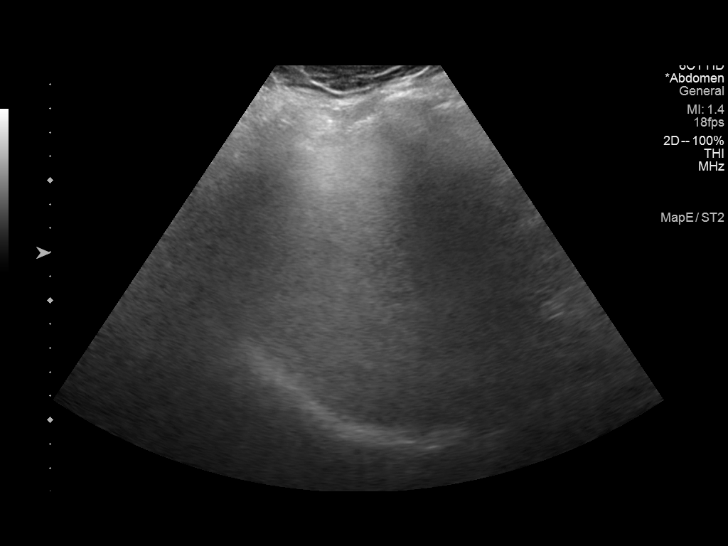
[im 42/42]
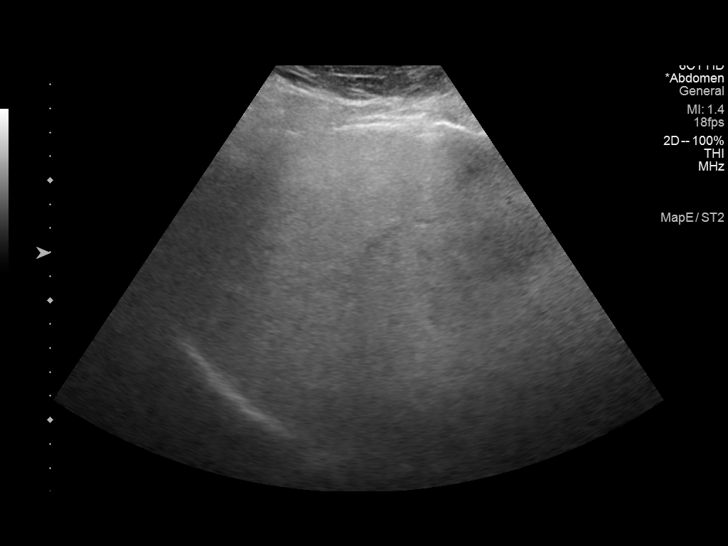

[14 of 25 positions shown; findings below may reference images not displayed]

FINDINGS: Gallbladder:

No gallstones or wall thickening visualized. No sonographic Murphy
sign noted by sonographer.

Common bile duct:

Diameter: 2 mm which is within normal limits

Liver:

No focal lesion identified. Increased echogenicity of hepatic
parenchyma is noted consistent with hepatic steatosis. Portal vein
is patent on color Doppler imaging with normal direction of blood
flow towards the liver.

Other: None.
IMPRESSION: Increased echogenicity of hepatic parenchyma is noted consistent
with hepatic steatosis. No other abnormality seen in the right upper
quadrant of the abdomen.

## 2024-11-16 NOTE — Progress Notes (Signed)
 Chief Complaint: Chief Complaint  Patient presents with   Follow-up    6 month follow up   Prostate cancer   History of Present Illness: Devon French is a 63 y.o. male with a history of the following:  No problems updated.  Last seen: 05/10/24 Notes from this encounter were reviewed and summarized as below:  Today's concerns: Prostate cancer  Related to this are the following factors: Location: prostate Severity: he has 3+4=7 prostate cancer on AS, last biopsy 03/12/22. Saw Dr. Gerome 10/17/24 who recommended repeat CT in 6 months to follow lung nodule and plan for biopsy if enlarging LAD Current Pain: 0/10 Timing/Duration: as above Modifying Factors: as above Associated Signs and Symptoms: listed below   Last PSA 11/10/24 was 6.1 Has lost 5-6lbs in 3 months due to lifestyle changes and was started on Mounjuaro Denies frequency/urgency Denies hematuria/dysuria Denies bone pain  Past Medical History: Past Medical History:  Diagnosis Date   Diabetes mellitus (*)    Hypertension    Prostate cancer (*)     Past Surgical History: Past Surgical History:  Procedure Laterality Date   Knee Bilateral    MCI   Wrist surgery Left    Cyst removal    Medications: Medications Ordered Prior to Encounter[1]  Allergies: Amlodipine-olmesartan, Angiotensin receptor blockers, Other, Statins, and Statins-hmg-coa reductase inhibitors  Family History: Family History  Problem Relation Age of Onset   Cancer Father    Cancer Brother        melanoma    Social History: Tobacco Use History[2]    Social History   Substance and Sexual Activity  Alcohol Use Yes   Alcohol/week: 2.0 standard drinks of alcohol   Types: 2 Glasses of wine per week   Comment: daily    Social History   Substance and Sexual Activity  Drug Use Never     Family History, and Social History were reviewed and updated as appropriate.  Review of Systems:  Positive Symptoms are  BOLDED  CONSTITUTIONAL: Fevers, chills, headache, weight change EYES, NOSE, THROAT: Glaucoma, glasses, sinus problems CARDIOVASCULAR: Chest pain, varicose veins, high blood pressure. RESPIRATORY: Wheezing, COPD, shortness of breath, sleep apnea. ENDOCRINE: Diabetes, thyroid disease, other.  MUSCULOSKELETAL: Joint pain, neck pain, back pain. GASTROINTESTINAL: Abdominal pain, constipation, diarrhea, nausea, vomiting, indigestion.  HEMATOLOGY: Easy bleeding, easy bruising, aspirin use within the last 2 weeks  NEUROLOGIC: Tremors, dizzy spells, numbness, tingling. GENITOURINARY: See above PSYCHOLOGIC: Anxiety, depression, other. All systems reviewed and otherwise negative except as above.    Objective   BMI was reviewed and is elevated.  Accompanied by self today  CONSTITUTIONAL: This a 63 y.o. male in no acute distress.   Vitals:   11/16/24 1036  BP: 139/86  Pulse: 109  Resp: 18  Temp: 97.3 F (36.3 C)   PSYCHOLOGIC:  Awake, alert and oriented to person, place and time. Normal mood and affect DERMATOLOGIC: Skin warm and dry RESPIRATORY: Respiratory effort normal CARDIOVASCULAR:  No cyanosis GASTROINTESTINAL: Abdomen is non-distended  NEUROLOGIC: No gross motor dysfunction. Does not use a walker or wheelchair to assist ambulation LYMPHATIC: No palpable inguinal lymphadenopathy MUSCULOSKELETAL: Range of motion grossly intact GENITOURINARY:   - DRE: Prostate soft, non-tender to palpation.  No palpable nodules. Size 35 grams.  - Kidneys normal. No CVA tenderness  - Bladder non-tender and not distended.  ECOG PERFORMANCE STATUS: 0 0 Fully active, able to carry on all pre-disease performance without restriction 1 Restricted in physically strenuous activity but ambulatory and able to carry  out work of a light or sedentary nature, e.g., light house work, office work 2 Ambulatory and capable of all selfcare but unable to carry out any work activities; up and about more than 50% of  waking hours 3 Capable of only limited selfcare; confined to bed or chair more than 50% of waking hours 4 Completely disabled; cannot carry on any selfcare; totally confined to bed or chair   Labs/Radiology   No visits with results within 1 Day(s) from this visit.  Latest known visit with results is:  Further Clinical Support on 11/10/2024  Component Date Value Ref Range Status   PSA 11/10/2024 6.1 (H)  0.0 - 4.0 ng/mL Final    Notable Labs reviewed:  Lab Results  Component Value Date   Creatinine 0.74 10/17/2024   Lab Results  Component Value Date   Testosterone, Serum (Total) 309 07/05/2024    PSA, Free % (%)  Date Value  03/03/2021 11.6  08/30/2020 12.9   PSA, Free (ng/mL)  Date Value  03/03/2021 0.52  08/30/2020 0.67   PSA (ng/mL)  Date Value  11/10/2024 6.1 (H)  10/17/2024 5.4 (H)  07/05/2024 6.1 (H)  04/08/2023 4.6 (H)  09/16/2021 5.7 (H)    Radiology:  No results found.  I have independently viewed the above imaging and agree with the impression as stated.   No results found for this or any previous visit.      Assessment and Plan  Devon French is a 63 y.o. male with the following diagnoses:  1. Elevated PSA    No problems updated.  Our plan is as follows:  Clerence was seen today for follow-up.  Diagnoses and all orders for this visit:  Elevated PSA -     Urinalysis; Future -     Urinalysis    PSA 6 months FU 6 months   Orders Placed This Encounter  Procedures   Urinalysis    Risks, benefits, and alternatives of the medications and treatment plan prescribed today were discussed, and patient expressed understanding.  Patient and or his family expresses understanding and all questions and concerns were answered. The patient is in agreement with the plan as stated above.   Portions of the note were entered using voice recognition software.  Minor syntax, contextual, and spelling errors may be related to the use of this  software and were not intentional.  If corrections are necessary, please contact provider.        [1] Current Outpatient Medications on File Prior to Visit  Medication Sig Dispense Refill   atorvastatin calcium (LIPITOR) 10 mg tablet Take one tablet (10 mg dose) by mouth daily.     empagliflozin (JARDIANCE) 10 mg TABS tablet 1 tablet     gabapentin (NEURONTIN) 100 mg capsule Take three capsules (300 mg dose) by mouth 3 (three) times a day.     metFORMIN (GLUCOPHAGE) 500 MG tablet Take one tablet (500 mg dose) by mouth 2 (two) times daily with meals.     Misc. Devices MISC PSA free and total.  Diagnosis: elevated PSA 1 each 0   tirzepatide (MOUNJARO) 2.5 mg/0.5 mL SOAJ pen injection Inject 0.5 mLs (2.5 mg dose) into the skin once a week.     No current facility-administered medications on file prior to visit.  [2] Social History Tobacco Use  Smoking Status Never  Smokeless Tobacco Never
